# Patient Record
Sex: Female | Born: 1980 | Race: White | Hispanic: No | Marital: Married | Smoking: Never smoker
Health system: Southern US, Community
[De-identification: ages and names within clinical notes are randomized; demographics above are authoritative.]

## PROBLEM LIST (undated history)

## (undated) DIAGNOSIS — I10 Essential (primary) hypertension: Secondary | ICD-10-CM

---

## 2014-01-10 ENCOUNTER — Encounter (HOSPITAL_COMMUNITY): Payer: Self-pay | Admitting: Emergency Medicine

## 2014-01-10 ENCOUNTER — Emergency Department (HOSPITAL_COMMUNITY): Payer: BC Managed Care – PPO

## 2014-01-10 ENCOUNTER — Emergency Department (HOSPITAL_COMMUNITY)
Admission: EM | Admit: 2014-01-10 | Discharge: 2014-01-10 | Disposition: A | Discharge status: Home / Self Care | Payer: BC Managed Care – PPO | Attending: Emergency Medicine | Admitting: Emergency Medicine

## 2014-01-10 DIAGNOSIS — O209 Hemorrhage in early pregnancy, unspecified: Secondary | ICD-10-CM | POA: Insufficient documentation

## 2014-01-10 DIAGNOSIS — R1032 Left lower quadrant pain: Secondary | ICD-10-CM | POA: Insufficient documentation

## 2014-01-10 DIAGNOSIS — O169 Unspecified maternal hypertension, unspecified trimester: Secondary | ICD-10-CM | POA: Insufficient documentation

## 2014-01-10 HISTORY — DX: Essential (primary) hypertension: I10

## 2014-01-10 LAB — URINALYSIS, ROUTINE W REFLEX MICROSCOPIC
Bilirubin Urine: NEGATIVE
GLUCOSE, UA: NEGATIVE mg/dL
Ketones, ur: NEGATIVE mg/dL
LEUKOCYTES UA: NEGATIVE
NITRITE: NEGATIVE
PH: 7 (ref 5.0–8.0)
Protein, ur: NEGATIVE mg/dL
SPECIFIC GRAVITY, URINE: 1.012 (ref 1.005–1.030)
Urobilinogen, UA: 0.2 mg/dL (ref 0.0–1.0)

## 2014-01-10 LAB — BASIC METABOLIC PANEL
ANION GAP: 15 (ref 5–15)
BUN: 9 mg/dL (ref 6–23)
CALCIUM: 9.5 mg/dL (ref 8.4–10.5)
CHLORIDE: 101 meq/L (ref 96–112)
CO2: 21 mEq/L (ref 19–32)
Creatinine, Ser: 0.55 mg/dL (ref 0.50–1.10)
GFR calc Af Amer: >90 mL/min (ref >90)
GFR calc non Af Amer: >90 mL/min (ref >90)
GLUCOSE: 92 mg/dL (ref 70–99)
Potassium: 3.9 mEq/L (ref 3.7–5.3)
SODIUM: 137 meq/L (ref 137–147)

## 2014-01-10 LAB — CBC
HCT: 38.6 % (ref 36.0–46.0)
HEMOGLOBIN: 13.2 g/dL (ref 12.0–15.0)
MCH: 29.9 pg (ref 26.0–34.0)
MCHC: 34.2 g/dL (ref 30.0–36.0)
MCV: 87.5 fL (ref 78.0–100.0)
PLATELETS: 242 10*3/uL (ref 150–400)
RBC: 4.41 MIL/uL (ref 3.87–5.11)
RDW: 13.8 % (ref 11.5–15.5)
WBC: 14.6 10*3/uL — ABNORMAL HIGH (ref 4.0–10.5)

## 2014-01-10 LAB — URINE MICROSCOPIC-ADD ON

## 2014-01-10 LAB — POC URINE PREG, ED: PREG TEST UR: POSITIVE — AB

## 2014-01-10 NOTE — Discharge Instructions (Signed)
Vaginal Bleeding During Pregnancy, First Trimester  A small amount of bleeding (spotting) from the vagina is relatively common in early pregnancy. It usually stops on its own. Various things may cause bleeding or spotting in early pregnancy. Some bleeding may be related to the pregnancy, and some may not. In most cases, the bleeding is normal and is not a problem. However, bleeding can also be a sign of something serious. Be sure to tell your health care provider about any vaginal bleeding right away.  Some possible causes of vaginal bleeding during the first trimester include:  · Infection or inflammation of the cervix.  · Growths (polyps) on the cervix.  · Miscarriage or threatened miscarriage.  · Pregnancy tissue has developed outside of the uterus and in a fallopian tube (tubal pregnancy).  · Tiny cysts have developed in the uterus instead of pregnancy tissue (molar pregnancy).  HOME CARE INSTRUCTIONS   Watch your condition for any changes. The following actions may help to lessen any discomfort you are feeling:  · Follow your health care provider's instructions for limiting your activity. If your health care provider orders bed rest, you may need to stay in bed and only get up to use the bathroom. However, your health care provider may allow you to continue light activity.  · If needed, make plans for someone to help with your regular activities and responsibilities while you are on bed rest.  · Keep track of the number of pads you use each day, how often you change pads, and how soaked (saturated) they are. Write this down.  · Do not use tampons. Do not douche.  · Do not have sexual intercourse or orgasms until approved by your health care provider.  · If you pass any tissue from your vagina, save the tissue so you can show it to your health care provider.  · Only take over-the-counter or prescription medicines as directed by your health care provider.  · Do not take aspirin because it can make you  bleed.  · Keep all follow-up appointments as directed by your health care provider.  SEEK MEDICAL CARE IF:  · You have any vaginal bleeding during any part of your pregnancy.  · You have cramps or labor pains.  · You have a fever, not controlled by medicine.  SEEK IMMEDIATE MEDICAL CARE IF:   · You have severe cramps in your back or belly (abdomen).  · You pass large clots or tissue from your vagina.  · Your bleeding increases.  · You feel light-headed or weak, or you have fainting episodes.  · You have chills.  · You are leaking fluid or have a gush of fluid from your vagina.  · You pass out while having a bowel movement.  MAKE SURE YOU:  · Understand these instructions.  · Will watch your condition.  · Will get help right away if you are not doing well or get worse.  Document Released: 04/05/2005 Document Revised: 07/01/2013 Document Reviewed: 03/03/2013  [employer]® Patient Information ©2015 [employer]. This information is not intended to replace advice given to you by your health care provider. Make sure you discuss any questions you have with your health care provider.

## 2014-01-10 NOTE — ED Provider Notes (Signed)
CSN: [id number]     Arrival date & time 01/10/14  1911 History   First MD Initiated Contact with Patient 01/10/14 1924     Chief Complaint  Patient presents with  . Vaginal Bleeding   33 y/o female with PMH of endometriosis and recurrent SABs that presents [redacted] weeks pregnant with sudden onset of vaginal bleeding and left sided pelvic pain. Patient states that today at rest the patient felt a gush of vaginal bleeding. Patient is having associated left sided sharp pain since the onset of vaginal bleeding. Denies nausea/vomiting, fever/chills, diarrhea or urinary symptoms.    (Consider location/radiation/quality/duration/timing/severity/associated sxs/prior Treatment) Patient is a 33 y.o. female presenting with vaginal bleeding.  Vaginal Bleeding Quality:  Bright red and clots Severity:  Moderate Onset quality:  Sudden Duration:  1 hour Progression:  Improving Chronicity:  New Possible pregnancy: yes   Context: at rest   Associated symptoms: abdominal pain   Associated symptoms: no back pain, no dysuria, no nausea and no vaginal discharge     Past Medical History  Diagnosis Date  . Hypertension    History reviewed. No pertinent past surgical history. No family history on file. History  Substance Use Topics  . Smoking status: Never Smoker   . Smokeless tobacco: Not on file  . Alcohol Use: No   OB History   Grav Para Term Preterm Abortions TAB SAB Ect Mult Living   1              Review of Systems  Constitutional: Negative for activity change.  HENT: Negative for congestion.   Respiratory: Negative for cough and shortness of breath.   Cardiovascular: Negative for chest pain and leg swelling.  Gastrointestinal: Positive for abdominal pain. Negative for nausea, vomiting, diarrhea, constipation, blood in stool and abdominal distention.  Genitourinary: Positive for vaginal bleeding. Negative for dysuria, flank pain and vaginal discharge.  Musculoskeletal: Negative for back pain.    Skin: Negative for color change.  Neurological: Negative for syncope and headaches.  Psychiatric/Behavioral: Negative for agitation.      Allergies  Review of patient's allergies indicates no known allergies.  Home Medications   Prior to Admission medications   Not on File   BP 153/103  Pulse 93  Temp(Src) 97.8 F (36.6 C) (Oral)  Resp 18  Wt 169 lb (76.658 kg)  SpO2 98% Physical Exam  Constitutional: She is oriented to person, place, and time. She appears well-developed.  HENT:  Head: Normocephalic.  Eyes: Pupils are equal, round, and reactive to light.  Neck: Neck supple.  Cardiovascular: Normal rate.  Exam reveals no gallop and no friction rub.   No murmur heard. Pulmonary/Chest: Effort normal and breath sounds normal. No respiratory distress.  Abdominal: Soft. She exhibits no distension. There is tenderness in the left lower quadrant. There is no rebound.    Genitourinary: There is bleeding around the vagina. No foreign body around the vagina. No vaginal discharge found.  Os closed on visual and manual exam, blood noted coming from the os but no POC.   Musculoskeletal: She exhibits no edema.  Neurological: She is alert and oriented to person, place, and time.  Skin: Skin is warm.  Psychiatric: She has a normal mood and affect.    ED Course  Procedures (including critical care time) Labs Review Labs Reviewed - No data to display  Imaging Review No results found.   EKG Interpretation None      MDM   Final diagnoses:  Vaginal bleeding in  pregnancy, first trimester   33 y/o female that is [redacted] weeks pregnant with history of multiple SABs presents with vaginal bleeding and LLQ pain.  On physical exam there is blood in vault with scant blood coming from the cervix. Os is closed no POC.  US shows IUP with FHR of 169. Patient is likely having a threatened abortion. This was discussed with the patient and she was instructed to follow-up with her OB/GYN. Patient  is from out of town but has close follow-up in [place] where she is from. Patient leaves tomorrow to go back home.      Clement SayresStaci Shan Valdes, MD 01/11/14 [phone]

## 2014-01-10 NOTE — ED Notes (Signed)
The [place] is [place] from [place] and she is 7-8  Weeks preg.  This is her 4th pegnancy  But she has no children.  lmp may 5th.  edc feb 10th 2016.    Lower abd pain since yesterday she had a gush of blood 45 minutes and a fairly large clot.  Just pta.  Now she has only spotting

## 2014-01-10 NOTE — ED Notes (Signed)
No change in pt status.  Remains alert, minimal pain comes and goes.

## 2014-01-10 NOTE — ED Notes (Signed)
G-4, P-0.  Reports sudden gush of blood from vaginal area with clot, now only spotting blood.  Feels only "slight" pain left lower abdomen.

## 2014-01-11 NOTE — ED Provider Notes (Signed)
Medical screening examination/treatment/procedure(s) were conducted as a shared visit with resident physician and myself.  I personally evaluated the patient during the encounter.   I interviewed and examined the patient. Lungs are CTAB. Cardiac exam wnl. Abdomen soft. US shows IUP. Likely threatened abortion. Return precautions provided.   Junius ArgyleForrest S Idalys Konecny, MD 01/11/14 [id number]

## 2014-05-12 ENCOUNTER — Encounter (HOSPITAL_COMMUNITY): Payer: Self-pay | Admitting: Emergency Medicine

## 2015-02-18 IMAGING — US US OB COMP LESS 14 WK
1 series · 14 of 28 positions shown · non-contrast
Comparison: None.

CLINICAL DATA: Pregnant, vaginal bleeding

EXAM:
OBSTETRIC <14 WK US AND TRANSVAGINAL OB US
TECHNIQUE: Both transabdominal and transvaginal ultrasound examinations were
performed for complete evaluation of the gestation as well as the
maternal uterus, adnexal regions, and pelvic cul-de-sac.
Transvaginal technique was performed to assess early pregnancy.

[Series 1: us ob comp less 14 wk · 0.18mm/px · 14 of 47 slices shown]
[im 2/47]
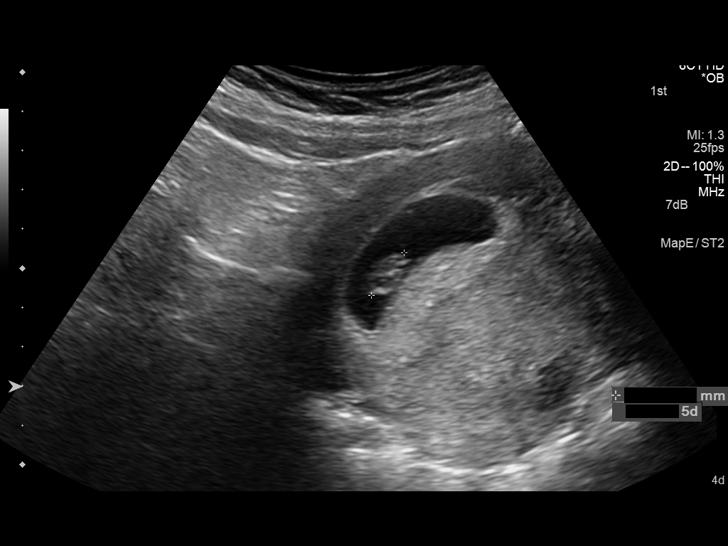
[im 6/47]
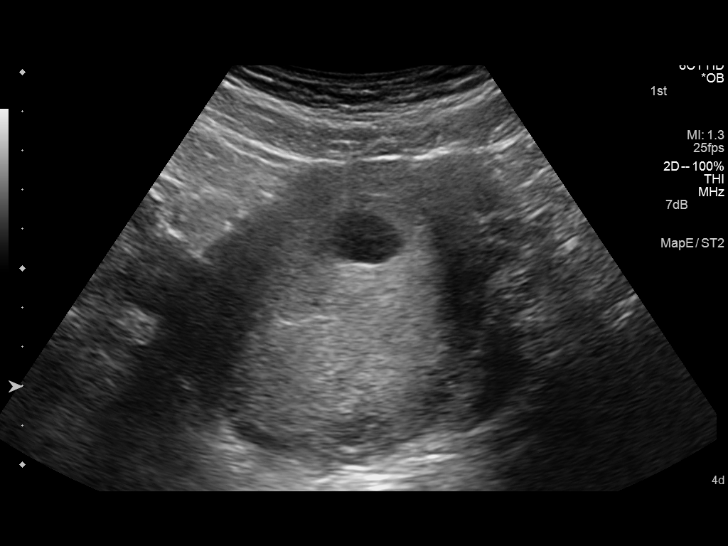
[im 9/47]
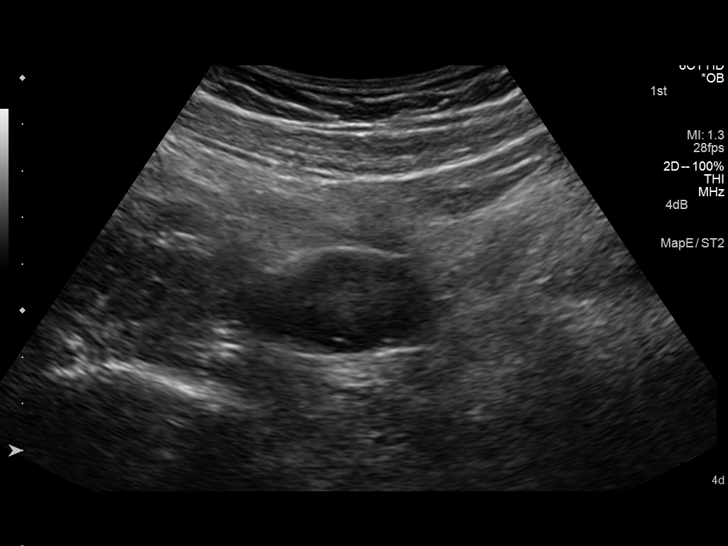
[im 12/47]
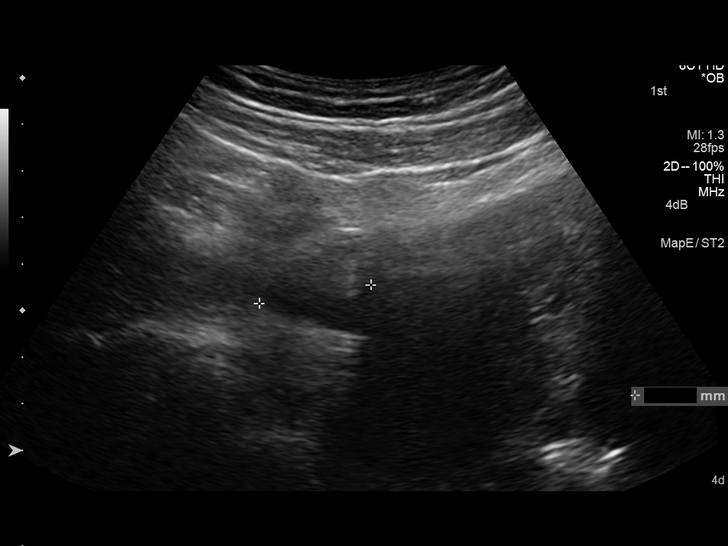
[im 16/47]
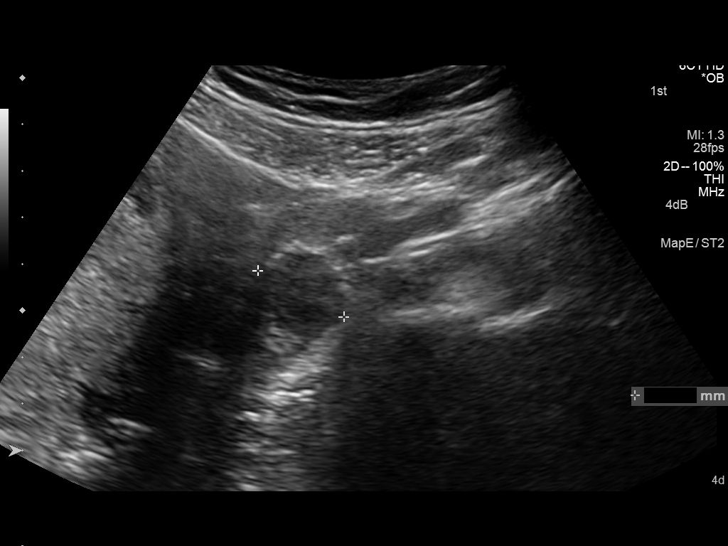
[im 19/47]
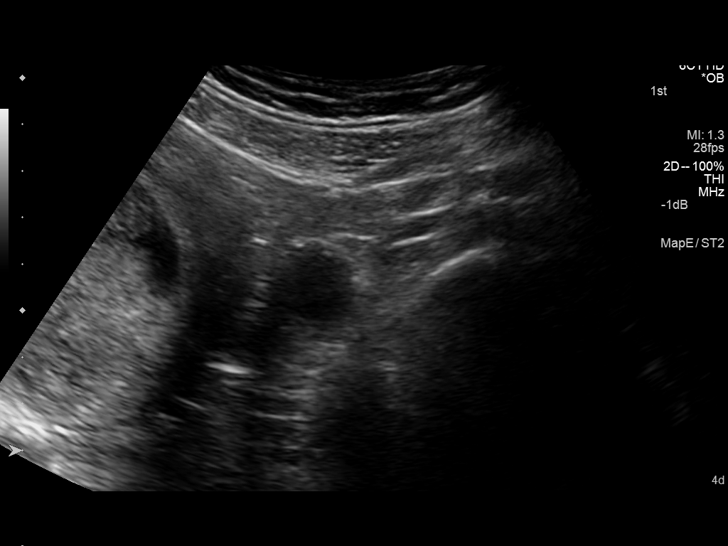
[im 23/47]
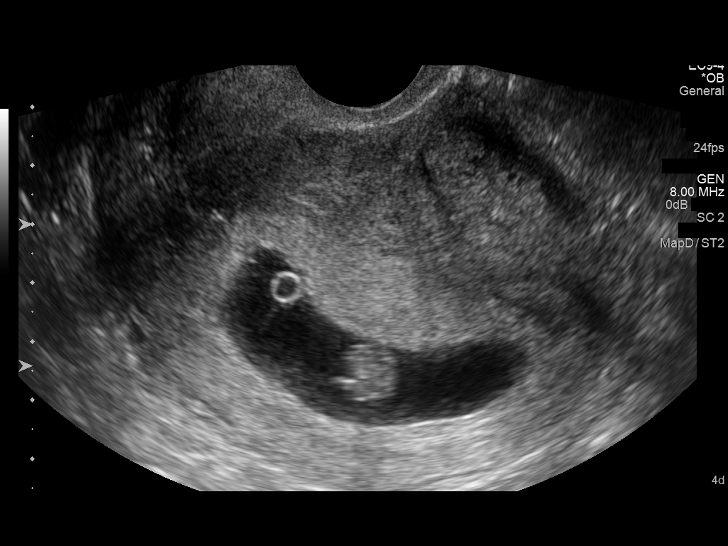
[im 26/47]
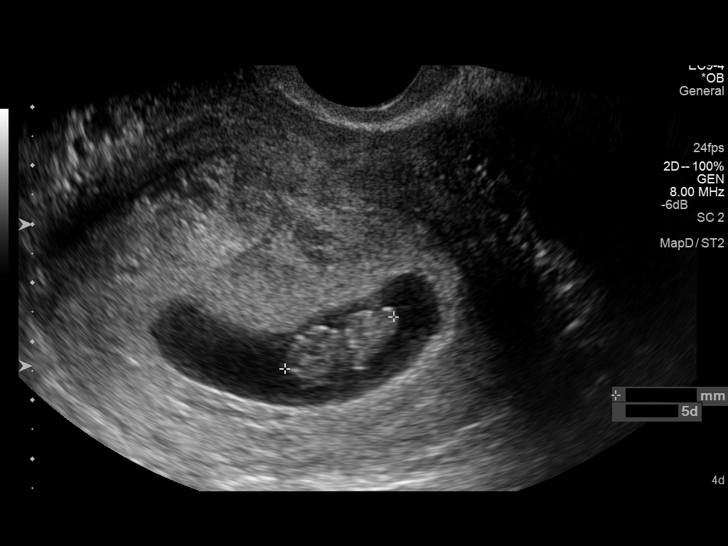
[im 29/47]
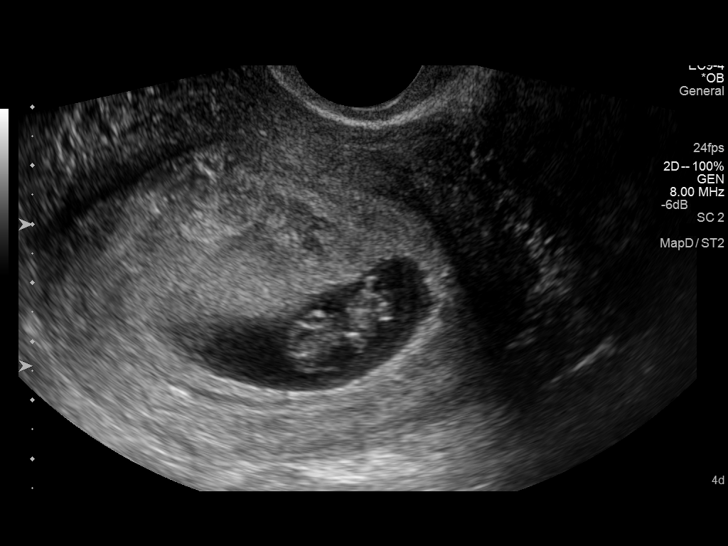
[im 33/47]
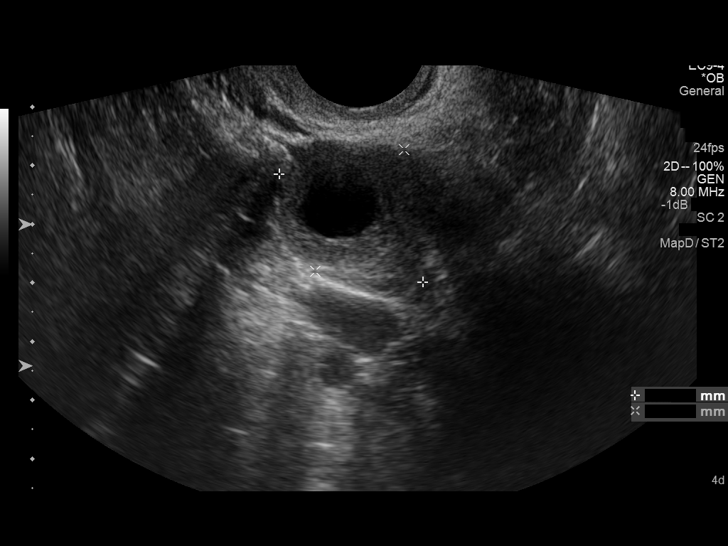
[im 36/47]
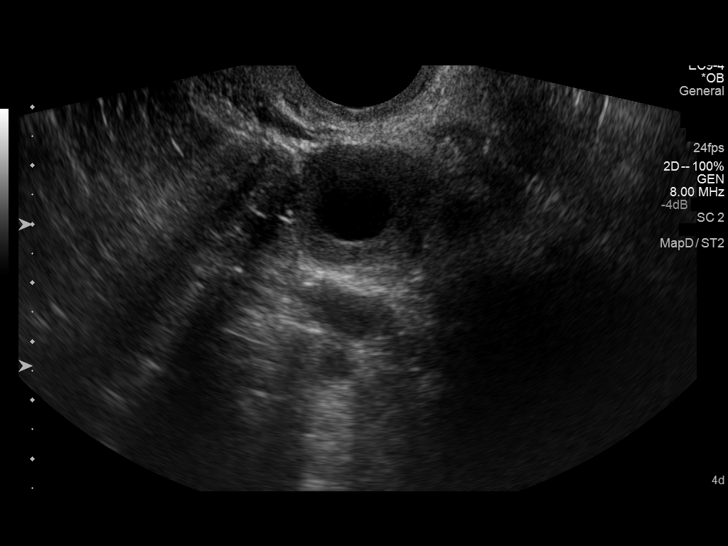
[im 40/47]
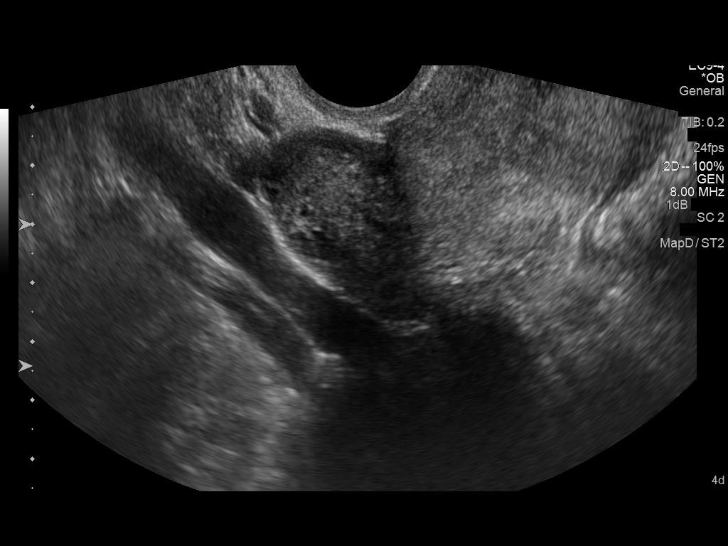
[im 43/47]
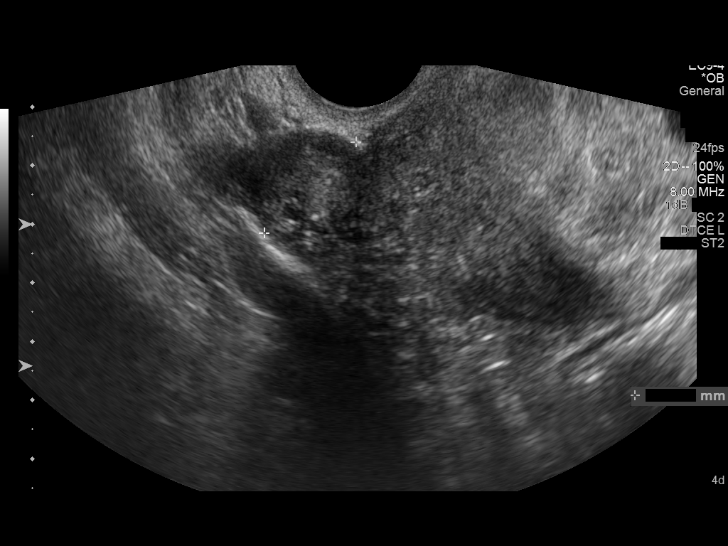
[im 47/47]
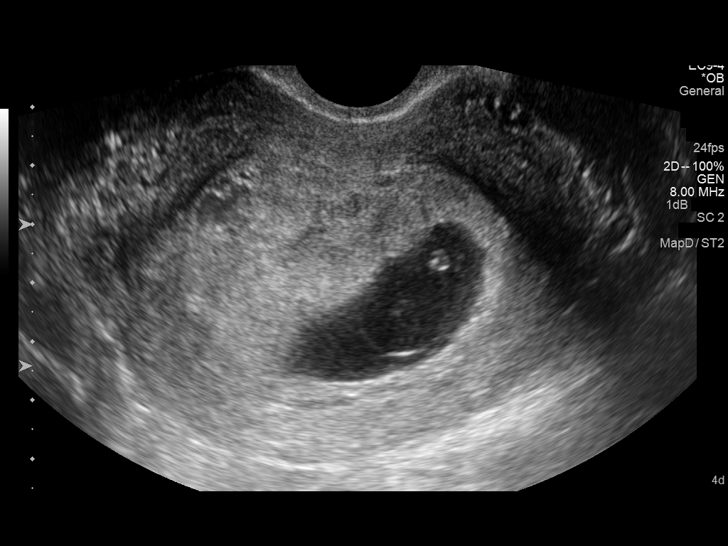

[14 of 28 positions shown; findings below may reference images not displayed]

FINDINGS: Intrauterine gestational sac: Visualized/normal in shape.

Yolk sac:  Present

Embryo:  Present

Cardiac Activity: Present

Heart Rate:  169 bpm

CRL:   18.4  mm   8 w 2 d                  US EDC: 08/20/2014

Maternal uterus/adnexae: Moderate subchronic hemorrhage.

Right ovary is within normal limits, measuring 4.0 x 2.4 x 2.2 cm.

Left ovary is within normal limits, measuring 3.1 x 2.6 x 2.4 cm,
and is notable for a corpus luteal cyst.

No free fluid.
IMPRESSION: Single live intrauterine gestation with estimated gestational age 8
weeks 2 days by crown-rump length.
